# Patient Record
Sex: Male | Born: 1990 | Race: White | Hispanic: No | Marital: Married | Smoking: Never smoker
Health system: Southern US, Community
[De-identification: ages and names within clinical notes are randomized; demographics above are authoritative.]

---

## 2018-06-16 ENCOUNTER — Emergency Department
Admission: EM | Admit: 2018-06-16 | Discharge: 2018-06-16 | Disposition: A | Discharge status: Home / Self Care | Payer: Federal, State, Local not specified - PPO | Attending: Emergency Medicine | Admitting: Emergency Medicine

## 2018-06-16 ENCOUNTER — Other Ambulatory Visit: Payer: Self-pay

## 2018-06-16 ENCOUNTER — Emergency Department: Payer: Federal, State, Local not specified - PPO

## 2018-06-16 ENCOUNTER — Encounter: Payer: Self-pay | Admitting: Emergency Medicine

## 2018-06-16 DIAGNOSIS — S82142A Displaced bicondylar fracture of left tibia, initial encounter for closed fracture: Secondary | ICD-10-CM | POA: Diagnosis not present

## 2018-06-16 DIAGNOSIS — W010XXA Fall on same level from slipping, tripping and stumbling without subsequent striking against object, initial encounter: Secondary | ICD-10-CM | POA: Insufficient documentation

## 2018-06-16 DIAGNOSIS — Y9359 Activity, other involving other sports and athletics played individually: Secondary | ICD-10-CM | POA: Insufficient documentation

## 2018-06-16 DIAGNOSIS — Y9289 Other specified places as the place of occurrence of the external cause: Secondary | ICD-10-CM | POA: Insufficient documentation

## 2018-06-16 DIAGNOSIS — S8992XA Unspecified injury of left lower leg, initial encounter: Secondary | ICD-10-CM | POA: Diagnosis present

## 2018-06-16 DIAGNOSIS — Y998 Other external cause status: Secondary | ICD-10-CM | POA: Diagnosis not present

## 2018-06-16 LAB — COMPREHENSIVE METABOLIC PANEL
ALT: 29 U/L (ref 0–44)
AST: 35 U/L (ref 15–41)
Albumin: 5 g/dL (ref 3.5–5.0)
Alkaline Phosphatase: 58 U/L (ref 38–126)
Anion gap: 15 (ref 5–15)
BUN: 12 mg/dL (ref 6–20)
CHLORIDE: 104 mmol/L (ref 98–111)
CO2: 21 mmol/L — AB (ref 22–32)
Calcium: 9.6 mg/dL (ref 8.9–10.3)
Creatinine, Ser: 1.48 mg/dL — ABNORMAL HIGH (ref 0.61–1.24)
GFR calc Af Amer: >60 mL/min (ref >60)
GFR calc non Af Amer: >60 mL/min (ref >60)
Glucose, Bld: 158 mg/dL — ABNORMAL HIGH (ref 70–99)
POTASSIUM: 3.6 mmol/L (ref 3.5–5.1)
SODIUM: 140 mmol/L (ref 135–145)
Total Bilirubin: 1.1 mg/dL (ref 0.3–1.2)
Total Protein: 7.6 g/dL (ref 6.5–8.1)

## 2018-06-16 LAB — CBC WITH DIFFERENTIAL/PLATELET
Abs Immature Granulocytes: 0.03 10*3/uL (ref 0.00–0.07)
Basophils Absolute: 0 10*3/uL (ref 0.0–0.1)
Basophils Relative: 0 %
Eosinophils Absolute: 0 10*3/uL (ref 0.0–0.5)
Eosinophils Relative: 0 %
HCT: 43.9 % (ref 39.0–52.0)
Hemoglobin: 14.7 g/dL (ref 13.0–17.0)
Immature Granulocytes: 0 %
LYMPHS ABS: 3.1 10*3/uL (ref 0.7–4.0)
LYMPHS PCT: 28 %
MCH: 30.2 pg (ref 26.0–34.0)
MCHC: 33.5 g/dL (ref 30.0–36.0)
MCV: 90.1 fL (ref 80.0–100.0)
Monocytes Absolute: 0.6 10*3/uL (ref 0.1–1.0)
Monocytes Relative: 5 %
Neutro Abs: 7.5 10*3/uL (ref 1.7–7.7)
Neutrophils Relative %: 67 %
Platelets: 252 10*3/uL (ref 150–400)
RBC: 4.87 MIL/uL (ref 4.22–5.81)
RDW: 12.5 % (ref 11.5–15.5)
WBC: 11.4 10*3/uL — ABNORMAL HIGH (ref 4.0–10.5)
nRBC: 0 % (ref 0.0–0.2)

## 2018-06-16 MED ORDER — OXYCODONE-ACETAMINOPHEN 5-325 MG PO TABS: 1.0000 | ORAL_TABLET | Freq: Four times a day (QID) | ORAL | 0 refills | Status: DC | PRN

## 2018-06-16 MED ORDER — SODIUM CHLORIDE 0.9 % IV BOLUS
1000.0000 mL | Freq: Once | INTRAVENOUS | Status: AC
  Administered 2018-06-16: 1000 mL via INTRAVENOUS

## 2018-06-16 NOTE — ED Notes (Signed)
During triage, pt said he felt light headed; he got pale and his pupils dilated significantly. Pt did not pass out, but appeared as though he might. Episode lasted about 30-45 seconds, after which his pupils returned to 81mm and his color returned to normal. He was given an ice pack for his neck, as he began to perspire after the episode. Pt alert & oriented at this time.

## 2018-06-16 NOTE — ED Notes (Signed)
PT states he is feeling better than in triage.  Color has returned and diaphoresis diminished.  Pt assisted onto stretcher, knees elevated in bed.

## 2018-06-16 NOTE — ED Triage Notes (Signed)
Pt via pov from home with left knee pain. He fell while playing disc golf and fell on it. Visible swelling to left leg. Pt alert & oriented with NAD noted.

## 2018-06-16 NOTE — ED Provider Notes (Signed)
[facility] Emergency Department Provider Note  ____________________________________________  Time seen: Approximately 4:08 PM  I have reviewed the triage vital signs and the nursing notes.   HISTORY  Chief Complaint Knee Pain    HPI Ryan Lin is a 28 y.o. male who presents the emergency department  after complaint of left knee injury.  Patient was playing disc golf when he caught his foot on a root, causing his like to twist and him falling landing on his knee.  Patient reports that this was on a sharp incline and he did fall and rolled down the hill.  He did not strike his head or lose consciousness.  Patient reports that he has significant pain with weightbearing but at rest there is minimal pain.  Patient reports that he did have an episode after the injury where he almost passed out.  Patient reports that things went "black" but he was able to hear people talking and feels sensations.  Again, patient did not hit his head.  He denies any headache, neck pain, chest pain, shortness of breath, abdominal pain, nausea or vomiting.  Only complaint is left knee pain/injury.  Pain is mild at this time.  No medications for his complaint prior to arrival.        History reviewed. No pertinent past medical history.  There are no active problems to display for this patient.   History reviewed. No pertinent surgical history.  Prior to Admission medications   Medication Sig Start Date End Date Taking? Authorizing Provider  oxyCODONE-acetaminophen (PERCOCET/ROXICET) 5-325 MG tablet Take 1 tablet by mouth every 6 (six) hours as needed for severe pain. 06/16/18   Kielan Dreisbach, Delorise Royals, PA-C    Allergies Patient has no allergy information on record.  History reviewed. No pertinent family history.  Social History Social History   Tobacco Use  . Smoking status: Never Smoker  . Smokeless tobacco: Never Used  Substance Use Topics  . Alcohol use: Not on file   Comment: occasional  . Drug use: Never     Review of Systems  Constitutional: No fever/chills Eyes: No visual changes. No discharge ENT: No upper respiratory complaints. Cardiovascular: no chest pain. Respiratory: no cough. No SOB. Gastrointestinal: No abdominal pain.  No nausea, no vomiting.   Musculoskeletal: Positive for left knee injury/pain Skin: Negative for rash, abrasions, lacerations, ecchymosis. Neurological: Negative for headaches, focal weakness or numbness.  Near syncope. 10-point ROS otherwise negative.  ____________________________________________   PHYSICAL EXAM:  VITAL SIGNS: ED Triage Vitals  Enc Vitals Group     BP 06/16/18 1552 (!) 97/55     Pulse Rate 06/16/18 1552 79     Resp 06/16/18 1552 18     Temp 06/16/18 1552 (!) 97.5 F (36.4 C)     Temp Source 06/16/18 1552 Oral     SpO2 06/16/18 1552 97 %     Weight 06/16/18 1553 225 lb (102.1 kg)     Height 06/16/18 1553 5\' 10"  (1.778 m)     Head Circumference --      Peak Flow --      Pain Score 06/16/18 1553 3     Pain Loc --      Pain Edu? --      Excl. in GC? --      Constitutional: Alert and oriented. Well appearing and in no acute distress. Eyes: Conjunctivae are normal. PERRL. EOMI. Head: Atraumatic. Neck: No stridor.  No tenderness to palpation of the cervical spine. Cardiovascular: Normal  rate, regular rhythm. Normal S1 and S2.  No murmurs, rubs, gallops.  Good peripheral circulation. Respiratory: Normal respiratory effort without tachypnea or retractions. Lungs CTAB. Good air entry to the bases with no decreased or absent breath sounds. Musculoskeletal: Full range of motion to all extremities. No gross deformities appreciated.  Visualization of the left knee reveals gross edema to the lateral aspect.  No gross deformity.  Patient is able to extend and flex the knee appropriately at this time.  No overlying abrasions or lacerations noted.  Patient is nontender to palpation over the distal  femur, patella, medial joint line.  Patient has mild tenderness to palpation along the lateral joint line and over the lateral aspect of the tibia.  No palpable abnormality other than soft tissue edema.  Pending imaging, no special tests of the knee are performed.  These will be performed after imaging.  Radial pulse and sensation intact distally.  Imaging returns with significant tibial plateau fracture.  No special tests of the knee are performed at this time.  Patient is still neurovascularly intact distally. Neurologic:  Normal speech and language. No gross focal neurologic deficits are appreciated.  Cranial nerves II through XII grossly intact. Skin:  Skin is warm, dry and intact. No rash noted. Psychiatric: Mood and affect are normal. Speech and behavior are normal. Patient exhibits appropriate insight and judgement.   ____________________________________________   LABS (all labs ordered are listed, but only abnormal results are displayed)  Labs Reviewed  COMPREHENSIVE METABOLIC PANEL - Abnormal; Notable for the following components:      Result Value   CO2 21 (*)    Glucose, Bld 158 (*)    Creatinine, Ser 1.48 (*)    All other components within normal limits  CBC WITH DIFFERENTIAL/PLATELET - Abnormal; Notable for the following components:   WBC 11.4 (*)    All other components within normal limits   ____________________________________________  EKG  ED ECG REPORT I, Delorise RoyalsJonathan D Quierra Silverio,  personally viewed and interpreted this ECG.   Date: 06/16/2018  EKG Time: 1628  Rate: 75  Rhythm: normal EKG, normal sinus rhythm, there are no previous tracings available for comparison  Axis: normal axis  Intervals:none  ST&T Change: Few scattered ST elevation segments noted in both the inferior, anterior and lateral leads.  Likely early repolarization.     ____________________________________________  RADIOLOGY I personally viewed and evaluated these images as part of my medical  decision making, as well as reviewing the written report by the radiologist.  Ct Knee Left Wo Contrast  Result Date: 06/16/2018 CLINICAL DATA:  Larey SeatFell while playing disc golf. Pain and swelling. Evaluate complex medial tibial plateau fracture. EXAM: CT OF THE left KNEE WITHOUT CONTRAST TECHNIQUE: Multidetector CT imaging of the left knee was performed according to the standard protocol. Multiplanar CT image reconstructions were also generated. COMPARISON:  Radiographs 06/16/2018 FINDINGS: Complex comminuted intra-articular fracture of the medial tibial plateau. This is a vertical split type fracture with significant displacement of the medial fragment up to 19 mm. The fracture also involves the tibial spines and extends into the lateral tibial plateau medially and posteriorly. The femur, patella and fibula are intact. Grossly the cruciate and collateral ligaments are intact. The MCL is moderately stretched. The quadriceps and patellar tendons are intact. Large lipohemarthrosis. IMPRESSION: 1. Complex comminuted longitudinal split type fracture of the medial tibial plateau as detailed above. Please see 3D images. 2. There is also a fracture through the tibial spines and involving the posterior and  medial aspect of the lateral tibial plateau. 3. No other fractures are identified. 4. Grossly intact ligaments and tendons. 5. Large lipohemarthrosis. Electronically Signed   By: Rudie Meyer M.D.   On: 06/16/2018 17:46   Dg Knee Complete 4 Views Left  Result Date: 06/16/2018 CLINICAL DATA:  Left knee pain following a twisting injury and fall today. EXAM: LEFT KNEE - COMPLETE 4+ VIEW COMPARISON:  None. FINDINGS: Vertical fracture through the medial tibial plateau with 6 mm of distraction of the fragments approximately. There is an associated moderate-sized effusion. IMPRESSION: Medial tibial plateau fracture with an associated moderate-sized effusion. Electronically Signed   By: Beckie Salts M.D.   On: 06/16/2018  17:02    ____________________________________________    PROCEDURES  Procedure(s) performed:    .Splint Application Date/Time: 06/16/2018 6:37 PM Performed by: Racheal Patches, PA-C Authorized by: Racheal Patches, PA-C   Consent:    Consent obtained:  Verbal   Consent given by:  Patient   Risks discussed:  Pain and swelling Pre-procedure details:    Sensation:  Normal Procedure details:    Laterality:  Left   Location:  Knee   Knee:  L knee   Splint type:  Knee immobilizer   Supplies:  Prefabricated splint Post-procedure details:    Pain:  Unchanged   Sensation:  Normal   Patient tolerance of procedure:  Tolerated well, no immediate complications      Medications  sodium chloride 0.9 % bolus 1,000 mL (0 mLs Intravenous Stopped 06/16/18 1835)     ____________________________________________   INITIAL IMPRESSION / ASSESSMENT AND PLAN / ED COURSE  Pertinent labs & imaging results that were available during my care of the patient were reviewed by me and considered in my medical decision making (see chart for details).  Review of the [place] CSRS was performed in accordance of the [facility] prior to dispensing any controlled drugs.  Clinical Course as of Jun 15 1941  Sat Jun 16, 2018  1620 Patient presents emergency department with complaints of left knee injury.  Patient reports that he injured his knee while playing this cough.  Patient does have appreciable edema to the lateral aspect of the knee.  Patient reports that he did not hit his head or lose consciousness during the original injury.  He denied any other complaints other than knee pain.  Patient had a near syncope episode where he reports things going "black" but still feeling sensation and hearing voices around him.  I suspect that this near syncopal episode was likely physiological due to pain and stress of injury, however he will be evaluated with EKG, basic lab work.  Patient will be given IV fluids as  his blood pressure is slightly soft at 97/55.  At this time, with a reassuring neuro exam, no indication of head injury, no headache or other neurological symptoms, I will withhold imaging of patient's head and neck region unless symptoms return or worsen.   [JC]    Clinical Course User Index [JC] Ryah Cribb, Delorise Royals, PA-C          Patient's diagnosis is consistent with a tibial plateau fracture.  Patient presented to the emergency department with a complaint of left knee injury.  On exam, patient had gross edema with no deformity.  Patient also had a presyncopal episode here in the emergency department.  Patient does have a significant tibial plateau fracture identified on x-ray.  Given location, nature of fracture I further evaluated area with CT scan.  I discussed the case with the on-call orthopedic surgeon, Dr. Odis Luster.  He is concerned at this time with acute ligamentous injury even with a reassuring CT given nature of injury.  At this time he recommends follow-up in the office, MRI prior to surgery.  Patient will be placed on pain medication for at home use.  Knee immobilizer with strict precautions to be nonweightbearing.  Patient is given crutches for ambulation.  He will follow-up with orthopedic surgery.. Patient is given ED precautions to return to the ED for any worsening or new symptoms.     ____________________________________________  FINAL CLINICAL IMPRESSION(S) / ED DIAGNOSES  Final diagnoses:  Closed fracture of left tibial plateau, initial encounter      NEW MEDICATIONS STARTED DURING THIS VISIT:  ED Discharge Orders         Ordered    oxyCODONE-acetaminophen (PERCOCET/ROXICET) 5-325 MG tablet  Every 6 hours PRN,   Status:  Discontinued     06/16/18 1833    oxyCODONE-acetaminophen (PERCOCET/ROXICET) 5-325 MG tablet  Every 6 hours PRN     06/16/18 1833              This chart was dictated using voice recognition software/Dragon. Despite best efforts to  proofread, errors can occur which can change the meaning. Any change was purely unintentional.    Racheal Patches, PA-C 06/16/18 1944    Sharman Cheek, MD 06/18/18 [phone]

## 2018-06-19 ENCOUNTER — Ambulatory Visit: Payer: Self-pay | Admitting: Orthopedic Surgery

## 2018-06-19 MED ORDER — CEFAZOLIN SODIUM-DEXTROSE 2-4 GM/100ML-% IV SOLN
2.0000 g | INTRAVENOUS | Status: AC
  Administered 2018-06-20: 2 g via INTRAVENOUS

## 2018-06-20 ENCOUNTER — Ambulatory Visit: Payer: Federal, State, Local not specified - PPO

## 2018-06-20 ENCOUNTER — Ambulatory Visit: Payer: Federal, State, Local not specified - PPO | Admitting: Anesthesiology

## 2018-06-20 ENCOUNTER — Encounter
Admission: RE | Disposition: A | Discharge status: Home / Self Care | Payer: Self-pay | Source: Home / Self Care | Attending: Orthopedic Surgery

## 2018-06-20 ENCOUNTER — Encounter: Payer: Self-pay | Admitting: *Deleted

## 2018-06-20 ENCOUNTER — Ambulatory Visit
Admission: RE | Admit: 2018-06-20 | Discharge: 2018-06-20 | Disposition: A | Discharge status: Home / Self Care | Payer: Federal, State, Local not specified - PPO | Attending: Orthopedic Surgery | Admitting: Orthopedic Surgery

## 2018-06-20 ENCOUNTER — Other Ambulatory Visit: Payer: Self-pay

## 2018-06-20 DIAGNOSIS — E669 Obesity, unspecified: Secondary | ICD-10-CM | POA: Diagnosis not present

## 2018-06-20 DIAGNOSIS — S82142A Displaced bicondylar fracture of left tibia, initial encounter for closed fracture: Secondary | ICD-10-CM | POA: Diagnosis present

## 2018-06-20 DIAGNOSIS — Y9353 Activity, golf: Secondary | ICD-10-CM | POA: Insufficient documentation

## 2018-06-20 DIAGNOSIS — Z6832 Body mass index (BMI) 32.0-32.9, adult: Secondary | ICD-10-CM | POA: Diagnosis not present

## 2018-06-20 DIAGNOSIS — Z419 Encounter for procedure for purposes other than remedying health state, unspecified: Secondary | ICD-10-CM

## 2018-06-20 DIAGNOSIS — W010XXA Fall on same level from slipping, tripping and stumbling without subsequent striking against object, initial encounter: Secondary | ICD-10-CM | POA: Insufficient documentation

## 2018-06-20 HISTORY — PX: ORIF TIBIA PLATEAU: SHX2132

## 2018-06-20 LAB — URINE DRUG SCREEN, QUALITATIVE (ARMC ONLY)
Amphetamines, Ur Screen: NOT DETECTED
Barbiturates, Ur Screen: NOT DETECTED
Benzodiazepine, Ur Scrn: NOT DETECTED
Cannabinoid 50 Ng, Ur ~~LOC~~: NOT DETECTED
Cocaine Metabolite,Ur ~~LOC~~: NOT DETECTED
MDMA (Ecstasy)Ur Screen: NOT DETECTED
METHADONE SCREEN, URINE: NOT DETECTED
Opiate, Ur Screen: NOT DETECTED
Phencyclidine (PCP) Ur S: NOT DETECTED
Tricyclic, Ur Screen: NOT DETECTED

## 2018-06-20 SURGERY — OPEN REDUCTION INTERNAL FIXATION (ORIF) TIBIAL PLATEAU
Anesthesia: Regional | Laterality: Left

## 2018-06-20 MED ORDER — OXYCODONE-ACETAMINOPHEN 5-325 MG PO TABS
ORAL_TABLET | ORAL | Status: AC
  Filled 2018-06-20: qty 1

## 2018-06-20 MED ORDER — ACETAMINOPHEN 500 MG PO TABS
1000.0000 mg | ORAL_TABLET | Freq: Once | ORAL | Status: AC
  Administered 2018-06-20: 1000 mg via ORAL

## 2018-06-20 MED ORDER — SUGAMMADEX SODIUM 200 MG/2ML IV SOLN
INTRAVENOUS | Status: DC | PRN
  Administered 2018-06-20: 200 mg via INTRAVENOUS

## 2018-06-20 MED ORDER — FAMOTIDINE 20 MG PO TABS
20.0000 mg | ORAL_TABLET | Freq: Once | ORAL | Status: AC
  Administered 2018-06-20: 20 mg via ORAL

## 2018-06-20 MED ORDER — LIDOCAINE HCL (CARDIAC) PF 100 MG/5ML IV SOSY
PREFILLED_SYRINGE | INTRAVENOUS | Status: DC | PRN
  Administered 2018-06-20: 100 mg via INTRAVENOUS

## 2018-06-20 MED ORDER — HYDROMORPHONE HCL 1 MG/ML IJ SOLN
0.2500 mg | INTRAMUSCULAR | Status: DC | PRN
  Administered 2018-06-20 (×4): 0.25 mg via INTRAVENOUS

## 2018-06-20 MED ORDER — DOCUSATE SODIUM 100 MG PO CAPS: 100.0000 mg | ORAL_CAPSULE | Freq: Every day | ORAL | 2 refills | Status: AC | PRN

## 2018-06-20 MED ORDER — FENTANYL CITRATE (PF) 100 MCG/2ML IJ SOLN
INTRAMUSCULAR | Status: AC
  Filled 2018-06-20: qty 2

## 2018-06-20 MED ORDER — MIDAZOLAM HCL 2 MG/2ML IJ SOLN
INTRAMUSCULAR | Status: DC | PRN
  Administered 2018-06-20: 2 mg via INTRAVENOUS

## 2018-06-20 MED ORDER — BUPIVACAINE LIPOSOME 1.3 % IJ SUSP: 20.0000 mL | Freq: Once | INTRAMUSCULAR | Status: DC

## 2018-06-20 MED ORDER — MIDAZOLAM HCL 2 MG/2ML IJ SOLN
1.0000 mg | Freq: Once | INTRAMUSCULAR | Status: AC
  Administered 2018-06-20: 1 mg via INTRAVENOUS

## 2018-06-20 MED ORDER — OXYCODONE-ACETAMINOPHEN 5-325 MG PO TABS: 1.0000 | ORAL_TABLET | ORAL | 0 refills | Status: AC | PRN

## 2018-06-20 MED ORDER — OXYCODONE-ACETAMINOPHEN 5-325 MG PO TABS
1.0000 | ORAL_TABLET | ORAL | Status: DC | PRN
  Administered 2018-06-20: 1 via ORAL

## 2018-06-20 MED ORDER — FENTANYL CITRATE (PF) 250 MCG/5ML IJ SOLN
INTRAMUSCULAR | Status: AC
  Filled 2018-06-20: qty 5

## 2018-06-20 MED ORDER — CHLORHEXIDINE GLUCONATE 4 % EX LIQD: 60.0000 mL | Freq: Once | CUTANEOUS | Status: DC

## 2018-06-20 MED ORDER — DEXAMETHASONE SODIUM PHOSPHATE 10 MG/ML IJ SOLN
INTRAMUSCULAR | Status: DC | PRN
  Administered 2018-06-20: 4 mg via INTRAVENOUS

## 2018-06-20 MED ORDER — DEXMEDETOMIDINE HCL IN NACL 200 MCG/50ML IV SOLN
INTRAVENOUS | Status: AC
  Filled 2018-06-20: qty 50

## 2018-06-20 MED ORDER — SUCCINYLCHOLINE CHLORIDE 20 MG/ML IJ SOLN
INTRAMUSCULAR | Status: DC | PRN
  Administered 2018-06-20: 100 mg via INTRAVENOUS

## 2018-06-20 MED ORDER — ROCURONIUM BROMIDE 100 MG/10ML IV SOLN
INTRAVENOUS | Status: DC | PRN
  Administered 2018-06-20: 40 mg via INTRAVENOUS

## 2018-06-20 MED ORDER — PROPOFOL 10 MG/ML IV BOLUS
INTRAVENOUS | Status: AC
  Filled 2018-06-20: qty 20

## 2018-06-20 MED ORDER — HYDROMORPHONE HCL 1 MG/ML IJ SOLN
INTRAMUSCULAR | Status: AC
  Filled 2018-06-20: qty 1

## 2018-06-20 MED ORDER — MIDAZOLAM HCL 2 MG/2ML IJ SOLN
INTRAMUSCULAR | Status: AC
  Filled 2018-06-20: qty 2

## 2018-06-20 MED ORDER — ACETAMINOPHEN 500 MG PO TABS
ORAL_TABLET | ORAL | Status: AC
  Administered 2018-06-20: 1000 mg via ORAL
  Filled 2018-06-20: qty 2

## 2018-06-20 MED ORDER — ONDANSETRON HCL 4 MG/2ML IJ SOLN
INTRAMUSCULAR | Status: DC | PRN
  Administered 2018-06-20: 4 mg via INTRAVENOUS

## 2018-06-20 MED ORDER — LIDOCAINE HCL (PF) 1 % IJ SOLN
INTRAMUSCULAR | Status: AC
  Filled 2018-06-20: qty 5

## 2018-06-20 MED ORDER — FENTANYL CITRATE (PF) 100 MCG/2ML IJ SOLN
50.0000 ug | Freq: Once | INTRAMUSCULAR | Status: AC
  Administered 2018-06-20: 50 ug via INTRAVENOUS

## 2018-06-20 MED ORDER — BUPIVACAINE HCL (PF) 0.5 % IJ SOLN
INTRAMUSCULAR | Status: AC
  Filled 2018-06-20: qty 10

## 2018-06-20 MED ORDER — SODIUM CHLORIDE 0.9 % IR SOLN
Status: DC | PRN
  Administered 2018-06-20: 1000 mL

## 2018-06-20 MED ORDER — LACTATED RINGERS IV SOLN
INTRAVENOUS | Status: DC
  Administered 2018-06-20 (×2): via INTRAVENOUS

## 2018-06-20 MED ORDER — DEXMEDETOMIDINE HCL 200 MCG/2ML IV SOLN
INTRAVENOUS | Status: DC | PRN
  Administered 2018-06-20 (×2): 12 ug via INTRAVENOUS

## 2018-06-20 MED ORDER — FAMOTIDINE 20 MG PO TABS
ORAL_TABLET | ORAL | Status: AC
  Administered 2018-06-20: 20 mg via ORAL
  Filled 2018-06-20: qty 1

## 2018-06-20 MED ORDER — SUGAMMADEX SODIUM 200 MG/2ML IV SOLN
INTRAVENOUS | Status: AC
  Filled 2018-06-20: qty 2

## 2018-06-20 MED ORDER — FENTANYL CITRATE (PF) 100 MCG/2ML IJ SOLN
INTRAMUSCULAR | Status: DC | PRN
  Administered 2018-06-20: 50 ug via INTRAVENOUS
  Administered 2018-06-20: 100 ug via INTRAVENOUS
  Administered 2018-06-20 (×2): 50 ug via INTRAVENOUS

## 2018-06-20 MED ORDER — PROPOFOL 10 MG/ML IV BOLUS
INTRAVENOUS | Status: DC | PRN
  Administered 2018-06-20: 40 mg via INTRAVENOUS
  Administered 2018-06-20: 160 mg via INTRAVENOUS

## 2018-06-20 MED ORDER — CEFAZOLIN SODIUM-DEXTROSE 2-4 GM/100ML-% IV SOLN
INTRAVENOUS | Status: AC
  Filled 2018-06-20: qty 100

## 2018-06-20 SURGICAL SUPPLY — 60 items
BANDAGE ACE 6X5 VEL STRL LF (GAUZE/BANDAGES/DRESSINGS) ×3 IMPLANT
BIT DRILL 2.5X110 QC LCP DISP (BIT) ×3 IMPLANT
BIT DRILL 2.8 (BIT) ×1
BIT DRILL CANN QC 2.8X165 (BIT) ×1 IMPLANT
BNDG COHESIVE 4X5 TAN STRL (GAUZE/BANDAGES/DRESSINGS) ×3 IMPLANT
BNDG ESMARK 6X12 TAN STRL LF (GAUZE/BANDAGES/DRESSINGS) ×3 IMPLANT
BRUSH SCRUB EZ  4% CHG (MISCELLANEOUS) ×2
BRUSH SCRUB EZ 4% CHG (MISCELLANEOUS) ×1 IMPLANT
CANISTER SUCT 1200ML W/VALVE (MISCELLANEOUS) ×3 IMPLANT
CHLORAPREP W/TINT 26 (MISCELLANEOUS) ×3 IMPLANT
COOLER POLAR GLACIER W/PUMP (MISCELLANEOUS) ×3 IMPLANT
COVER WAND RF STERILE (DRAPES) IMPLANT
CUFF TOURN SGL QUICK 24 (TOURNIQUET CUFF)
CUFF TOURN SGL QUICK 30 (TOURNIQUET CUFF) ×2
CUFF TRNQT CYL 24X4X16.5-23 (TOURNIQUET CUFF) IMPLANT
CUFF TRNQT CYL 30X4X21-28X (TOURNIQUET CUFF) ×1 IMPLANT
DRAPE C-ARM XRAY 36X54 (DRAPES) ×3 IMPLANT
DRAPE C-ARMOR (DRAPES) ×3 IMPLANT
DRAPE INCISE IOBAN 66X45 STRL (DRAPES) ×3 IMPLANT
DRAPE TABLE BACK 80X90 (DRAPES) ×3 IMPLANT
DRILL BIT 2.8MM (BIT) ×2
DRSG AQUACEL AG ADV 3.5X10 (GAUZE/BANDAGES/DRESSINGS) IMPLANT
ELECT REM PT RETURN 9FT ADLT (ELECTROSURGICAL) ×3
ELECTRODE REM PT RTRN 9FT ADLT (ELECTROSURGICAL) ×1 IMPLANT
GAUZE PETRO XEROFOAM 1X8 (MISCELLANEOUS) ×3 IMPLANT
GAUZE SPONGE 4X4 12PLY STRL (GAUZE/BANDAGES/DRESSINGS) ×3 IMPLANT
GLOVE INDICATOR 8.0 STRL GRN (GLOVE) ×3 IMPLANT
GLOVE SURG ORTHO 8.0 STRL STRW (GLOVE) ×3 IMPLANT
GOWN STRL REUS W/ TWL LRG LVL3 (GOWN DISPOSABLE) ×1 IMPLANT
GOWN STRL REUS W/ TWL XL LVL3 (GOWN DISPOSABLE) ×1 IMPLANT
GOWN STRL REUS W/TWL LRG LVL3 (GOWN DISPOSABLE) ×2
GOWN STRL REUS W/TWL XL LVL3 (GOWN DISPOSABLE) ×2
KIT TURNOVER KIT A (KITS) ×3 IMPLANT
MAT ABSORB  FLUID 56X50 GRAY (MISCELLANEOUS) ×2
MAT ABSORB FLUID 56X50 GRAY (MISCELLANEOUS) ×1 IMPLANT
NS IRRIG 1000ML POUR BTL (IV SOLUTION) ×3 IMPLANT
PACK EXTREMITY ARMC (MISCELLANEOUS) ×3 IMPLANT
PAD ABD DERMACEA PRESS 5X9 (GAUZE/BANDAGES/DRESSINGS) ×6 IMPLANT
PAD CAST CTTN 4X4 STRL (SOFTGOODS) ×1 IMPLANT
PAD PREP 24X41 OB/GYN DISP (PERSONAL CARE ITEMS) ×3 IMPLANT
PAD WRAPON POLAR KNEE (MISCELLANEOUS) ×1 IMPLANT
PADDING CAST COTTON 4X4 STRL (SOFTGOODS) ×2
PLATE TIBIA PROX 6 H 35MM (Orthopedic Implant) ×1 IMPLANT
SCREW CORTEX 3.5 28MM (Screw) ×2 IMPLANT
SCREW CORTEX 3.5 36MM (Screw) ×2 IMPLANT
SCREW CORTEX 3.5 50MM (Screw) ×3 IMPLANT
SCREW CORTEX 3.5 60MM (Screw) ×3 IMPLANT
SCREW CORTEX 3.5X80MM (Screw) ×3 IMPLANT
SCREW LOCK CORT ST 3.5X28 (Screw) ×1 IMPLANT
SCREW LOCK CORT ST 3.5X36 (Screw) ×1 IMPLANT
SCREW LOCKING SLF TAP 3.5X70MM (Screw) ×6 IMPLANT
SPONGE LAP 18X18 RF (DISPOSABLE) ×3 IMPLANT
STAPLER SKIN PROX 35W (STAPLE) ×3 IMPLANT
STOCKINETTE BIAS CUT 6 980064 (GAUZE/BANDAGES/DRESSINGS) ×3 IMPLANT
STOCKINETTE IMPERVIOUS 9X36 MD (GAUZE/BANDAGES/DRESSINGS) ×3 IMPLANT
SUT VIC AB 2-0 CT1 27 (SUTURE)
SUT VIC AB 2-0 CT1 TAPERPNT 27 (SUTURE) IMPLANT
SUT VICRYL+ 3-0 36IN CT-1 (SUTURE) IMPLANT
TIBIA PLATE PROX 6 H 35MM (Orthopedic Implant) ×3 IMPLANT
WRAPON POLAR PAD KNEE (MISCELLANEOUS) ×3

## 2018-06-20 NOTE — Anesthesia Procedure Notes (Signed)
Procedure Name: Intubation Date/Time: 06/20/2018 10:45 AM Performed by: Justus Memory, CRNA Pre-anesthesia Checklist: Patient identified, Patient being monitored, Timeout performed, Emergency Drugs available and Suction available Patient Re-evaluated:Patient Re-evaluated prior to induction Oxygen Delivery Method: Circle system utilized Preoxygenation: Pre-oxygenation with 100% oxygen Induction Type: IV induction Ventilation: Mask ventilation without difficulty Laryngoscope Size: Mac and 3 Grade View: Grade I Tube type: Oral Tube size: 7.5 mm Number of attempts: 1 Airway Equipment and Method: Stylet Placement Confirmation: ETT inserted through vocal cords under direct vision,  positive ETCO2 and breath sounds checked- equal and bilateral Secured at: 21 cm Tube secured with: Tape Dental Injury: Teeth and Oropharynx as per pre-operative assessment

## 2018-06-20 NOTE — Anesthesia Postprocedure Evaluation (Signed)
Anesthesia Post Note  Patient: Ryan Lin  Procedure(s) Performed: OPEN REDUCTION INTERNAL FIXATION (ORIF) TIBIAL PLATEAU, LEFT (Left )  Patient location during evaluation: PACU Anesthesia Type: Regional and General Level of consciousness: awake and alert Pain management: pain level controlled Vital Signs Assessment: post-procedure vital signs reviewed and stable Respiratory status: spontaneous breathing, nonlabored ventilation, respiratory function stable and patient connected to nasal cannula oxygen Cardiovascular status: blood pressure returned to baseline and stable Postop Assessment: no apparent nausea or vomiting Anesthetic complications: no     Last Vitals:  Vitals:   06/20/18 1327 06/20/18 1403  BP: (!) 143/89 (!) 142/83  Pulse: 77 88  Resp: 18 18  Temp: 36.6 C 36.7 C  SpO2: 93% 94%    Last Pain:  Vitals:   06/20/18 1403  TempSrc: Temporal  PainSc: 5                  Jovita Gamma

## 2018-06-20 NOTE — Anesthesia Post-op Follow-up Note (Signed)
Anesthesia QCDR form completed.        

## 2018-06-20 NOTE — Transfer of Care (Signed)
Immediate Anesthesia Transfer of Care Note  Patient: Ryan Lin  Procedure(s) Performed: OPEN REDUCTION INTERNAL FIXATION (ORIF) TIBIAL PLATEAU, LEFT (Left )  Patient Location: PACU  Anesthesia Type:General  Level of Consciousness: sedated  Airway & Oxygen Therapy: Patient Spontanous Breathing and Patient connected to face mask oxygen  Post-op Assessment: Report given to RN and Post -op Vital signs reviewed and stable  Post vital signs: Reviewed and stable  Last Vitals:  Vitals Value Taken Time  BP 124/85 06/20/2018 12:43 PM  Temp 36.1 C 06/20/2018 12:43 PM  Pulse 82 06/20/2018 12:50 PM  Resp 20 06/20/2018 12:50 PM  SpO2 98 % 06/20/2018 12:50 PM  Vitals shown include unvalidated device data.  Last Pain:  Vitals:   06/20/18 1243  TempSrc:   PainSc: Asleep         Complications: No apparent anesthesia complications

## 2018-06-20 NOTE — Anesthesia Procedure Notes (Signed)
Anesthesia Regional Block: Femoral nerve block   Pre-Anesthetic Checklist: ,, timeout performed, Correct Patient, Correct Site, Correct Laterality, Correct Procedure, Correct Position, site marked, Risks and benefits discussed,  Surgical consent,  Pre-op evaluation,  At surgeon's request and post-op pain management  Laterality: Left  Prep: chloraprep       Needles:  Injection technique: Single-shot  Needle Type: Stimiplex     Needle Length: 9cm  Needle Gauge: 21     Additional Needles:   Procedures:,,,, ultrasound used (permanent image in chart),,,,  Narrative:  Start time: 06/20/2018 9:40 AM End time: 06/20/2018 9:44 AM Injection made incrementally with aspirations every 5 mL.  Performed by: Personally  Anesthesiologist: Jovita Gamma, MD  Additional Notes: Patient consented for risk and benefits of nerve block including but not limited to nerve damage, failed block, bleeding and infection.  Patient voiced understanding.  Functioning IV was confirmed and monitors were applied.  A echogenic needle was used. Sterile prep, sterile drapes, hand hygiene and sterile gloves were used. Minimal sedation used for procedure.   No paresthesia endorsed by patient during the procedure.  Negative aspiration prior to incremental administration of local anesthetic. The patient tolerated the procedure well with no immediate complications.

## 2018-06-20 NOTE — Anesthesia Preprocedure Evaluation (Addendum)
Anesthesia Evaluation  Patient identified by MRN, date of birth, ID band Patient awake    Reviewed: Allergy & Precautions, H&P , NPO status , Patient's Chart, lab work & pertinent test results  Airway Mallampati: I  TM Distance: >3 FB     Dental  (+) Teeth Intact   Pulmonary neg pulmonary ROS, neg sleep apnea, neg COPD, neg recent URI,           Cardiovascular (-) hypertension(-) anginanegative cardio ROS  (-) dysrhythmias      Neuro/Psych negative neurological ROS  negative psych ROS   GI/Hepatic negative GI ROS, Neg liver ROS, neg GERD  ,  Endo/Other  negative endocrine ROS  Renal/GU      Musculoskeletal   Abdominal   Peds  Hematology negative hematology ROS (+)   Anesthesia Other Findings Obese  History reviewed. No pertinent past medical history.  History reviewed. No pertinent surgical history.  BMI    Body Mass Index:  32.28 kg/m      Reproductive/Obstetrics negative OB ROS                           Anesthesia Physical Anesthesia Plan  ASA: II  Anesthesia Plan: General ETT and Regional   Post-op Pain Management:    Induction:   PONV Risk Score and Plan: Dexamethasone, Ondansetron, Midazolam and Treatment may vary due to age or medical condition  Airway Management Planned:   Additional Equipment:   Intra-op Plan:   Post-operative Plan:   Informed Consent: I have reviewed the patients History and Physical, chart, labs and discussed the procedure including the risks, benefits and alternatives for the proposed anesthesia with the patient or authorized representative who has indicated his/her understanding and acceptance.     Dental Advisory Given  Plan Discussed with: Anesthesiologist and CRNA  Anesthesia Plan Comments: (Femoral nerve block + GA)      Anesthesia Quick Evaluation

## 2018-06-20 NOTE — Op Note (Signed)
06/20/2018  12:36 PM  PATIENT:  Ryan Lin    PRE-OPERATIVE DIAGNOSIS:  I33.825K DISPLACED FREACTURE OF LEFT TIBIA  POST-OPERATIVE DIAGNOSIS:  Same  PROCEDURE:  OPEN REDUCTION INTERNAL FIXATION (ORIF) TIBIAL PLATEAU, LEFT  SURGEON:  Lyndle Herrlich, MD  ASSIST: Altamese Cabal, PA-C  ANESTHESIA: General and Femoral nerve block  PREOPERATIVE INDICATIONS:  Ryan Lin is a  28 y.o. male with a diagnosis of S82.112A DISPLACED FREACTURE OF LEFT TIBIA who elected for surgical management after extensive discussion of  the risks benefits and alternatives  with the patient preoperatively including but not limited to the risks of infection, bleeding, nerve injury, cardiopulmonary complications, the need for revision surgery, among others, and the patient was willing to proceed.  EBL: 50 cc  OPERATIVE IMPLANTS: Synthes 6 hole medial tibial plate, 2 locking screws, 5 non-locking screws  OPERATIVE FINDINGS: Unstable bicondylar tibial plateau fracture  DESCRIPTION OF PROCEDURE:  The patient was brought to the operating room and underwent satisfactory anesthesia and was placed in the supine position on the fracture table.  The operative leg was prepped and draped in sterile fashion.  A longitudinal lazy S type medial incision was made centered over the medial femur and proximal tibia with dissection carried out sharply to fascia.  The medial hamstrings were divided and tagged for later repair.  The medial tibia was exposed.  Irrigation was used. The fracture fragments were manipulated into the best possible position due to the comminution. The central fragment was elevated.  A Synthes medial tibial plate was aligned along the tibia.  It was temporarily fixated with K wires proximally.  The plate was compressed proximally and distally with large bone holding clamps. Next a compression screw was placed proximally with excellent restoration of the medial joint line.  Cortical screws were then placed  through the distal portion of the plate.  The remaining proximal holes were filled with locking screws.  Fluoroscopy showed overall good alignment on AP and lateral views. Wound was again irrigated and closed with #1 Vicryl, #2 Quill on fascia, 2-0 Vicryl on subcutaneous tissue and staples on all skin areas and staples for the lateral stab wounds.  A sterile dressing was applied.  Sponge and needle count was correct.  Soft long-leg dressing, Cryocuff and knee immobilizer were applied.  The patient was awakened and taken to recovery in good condition.   Cassell Smiles, MD

## 2018-06-20 NOTE — Discharge Instructions (Addendum)
STRICT NON-WEIGHTBEARING ON THE LEFT LEG May remove dressing and place band-aid on Monday Call [phone] with questions    AMBULATORY SURGERY  DISCHARGE INSTRUCTIONS   1) The drugs that you were given will stay in your system until tomorrow so for the next 24 hours you should not:  A) Drive an automobile B) Make any legal decisions C) Drink any alcoholic beverage   2) You may resume regular meals tomorrow.  Today it is better to start with liquids and gradually work up to solid foods.  You may eat anything you prefer, but it is better to start with liquids, then soup and crackers, and gradually work up to solid foods.   3) Please notify your doctor immediately if you have any unusual bleeding, trouble breathing, redness and pain at the surgery site, drainage, fever, or pain not relieved by medication.    4) Additional Instructions:        Please contact your physician with any problems or Same Day Surgery at [phone], Monday through Friday 6 am to 4 pm, or [place] at [facility] number at [phone].

## 2018-06-20 NOTE — H&P (Signed)
The patient has been re-examined, and the chart reviewed, and there have been no interval changes to the documented history and physical.  Plan a left tibial plateau open reduction and internal fixation today.  Anesthesia is consulted regarding a peripheral nerve block for post-operative pain.  The risks, benefits, and alternatives have been discussed at length, and the patient is willing to proceed.

## 2018-07-24 NOTE — H&P (Signed)
PREOPERATIVE H&P  Chief Complaint: [id number] DISPLACED FREACTURE OF LEFT TIBIA  HPI: Ryan Lin is a 28 y.o. male who presents for preoperative history and physical with a diagnosis of S82.112A DISPLACED FREACTURE OF LEFT TIBIA. Symptoms are rated as moderate to severe, and have been worsening.  This is significantly impairing activities of daily living.  He has elected for surgical management.   History reviewed. No pertinent past medical history. Past Surgical History:  Procedure Laterality Date  . ORIF TIBIA PLATEAU Left 06/20/2018   Procedure: OPEN REDUCTION INTERNAL FIXATION (ORIF) TIBIAL PLATEAU, LEFT;  Surgeon: Lyndle HerrlichBowers, Maddon Horton R, MD;  Location: [facility];  Service: Orthopedics;  Laterality: Left;   Social History   Socioeconomic History  . Marital status: Single    Spouse name: Not on file  . Number of children: Not on file  . Years of education: Not on file  . Highest education level: Not on file  Occupational History  . Not on file  Social Needs  . Financial resource strain: Not on file  . Food insecurity:    Worry: Not on file    Inability: Not on file  . Transportation needs:    Medical: Not on file    Non-medical: Not on file  Tobacco Use  . Smoking status: Never Smoker  . Smokeless tobacco: Never Used  Substance and Sexual Activity  . Alcohol use: Not on file    Comment: occasional  . Drug use: Never  . Sexual activity: Not on file  Lifestyle  . Physical activity:    Days per week: Not on file    Minutes per session: Not on file  . Stress: Not on file  Relationships  . Social connections:    Talks on phone: Not on file    Gets together: Not on file    Attends religious service: Not on file    Active member of club or organization: Not on file    Attends meetings of clubs or organizations: Not on file    Relationship status: Not on file  Other Topics Concern  . Not on file  Social History Narrative  . Not on file   History reviewed. No pertinent  family history. No Known Allergies Prior to Admission medications   Medication Sig Start Date End Date Taking? Authorizing Provider  ibuprofen (ADVIL,MOTRIN) 200 MG tablet Take 200 mg by mouth every 8 (eight) hours as needed (pain.).   Yes [provider]  docusate sodium (COLACE) 100 MG capsule Take 1 capsule (100 mg total) by mouth daily as needed. 06/20/18 06/20/19  Lyndle HerrlichBowers, Mekenzie Modeste R, MD  oxyCODONE-acetaminophen (PERCOCET) 5-325 MG tablet Take 1 tablet by mouth every 4 (four) hours as needed for severe pain. 06/20/18 06/20/19  Lyndle HerrlichBowers, Juanisha Bautch R, MD     Positive ROS: All other systems have been reviewed and were otherwise negative with the exception of those mentioned in the HPI and as above.  Physical Exam: General: Alert, no acute distress Cardiovascular: Regular rate and rhythm, no murmurs rubs or gallops.  No pedal edema Respiratory: Clear to auscultation bilaterally, no wheezes rales or rhonchi. No cyanosis, no use of accessory musculature GI: No organomegaly, abdomen is soft and non-tender nondistended with positive bowel sounds. Skin: Skin intact, no lesions within the operative field. Neurologic: Sensation intact distally Psychiatric: Patient is competent for consent with normal mood and affect Lymphatic: No axillary or cervical lymphadenopathy  MUSCULOSKELETAL: left knee moderate swelling, skin clean and dry, compartments soft, tender throughout, motor and  sensory intact distally  Assessment: S82.112A DISPLACED FRACTURE OF LEFT TIBIA, PROXIMAL END  Plan: Plan for Procedure(s): OPEN REDUCTION INTERNAL FIXATION (ORIF) TIBIAL PLATEAU, LEFT  I discussed the risks and benefits of surgery. The risks include but are not limited to infection, bleeding requiring blood transfusion, nerve or blood vessel injury, joint stiffness or loss of motion, persistent pain, weakness or instability, malunion, nonunion and hardware failure and the need for further surgery. Medical risks include  but are not limited to DVT and pulmonary embolism, myocardial infarction, stroke, pneumonia, respiratory failure and death. Patient understood these risks and wished to proceed.   Lyndle Herrlich, MD   07/24/2018 2:53 PM

## 2019-07-21 IMAGING — CR LEFT KNEE - 1-2 VIEW
5 series · 5 of 5 positions shown · non-contrast
Comparison: Plain films and CT scan left knee 06/16/2018.

CLINICAL DATA: The patient suffered a medial tibial plateau
fracture in a fall playing disc golf 06/16/2018. Initial encounter.

EXAM:
LEFT KNEE - 1-2 VIEW; DG C-ARM 1-60 MIN-NO REPORT

[cont. (1 of 5)]
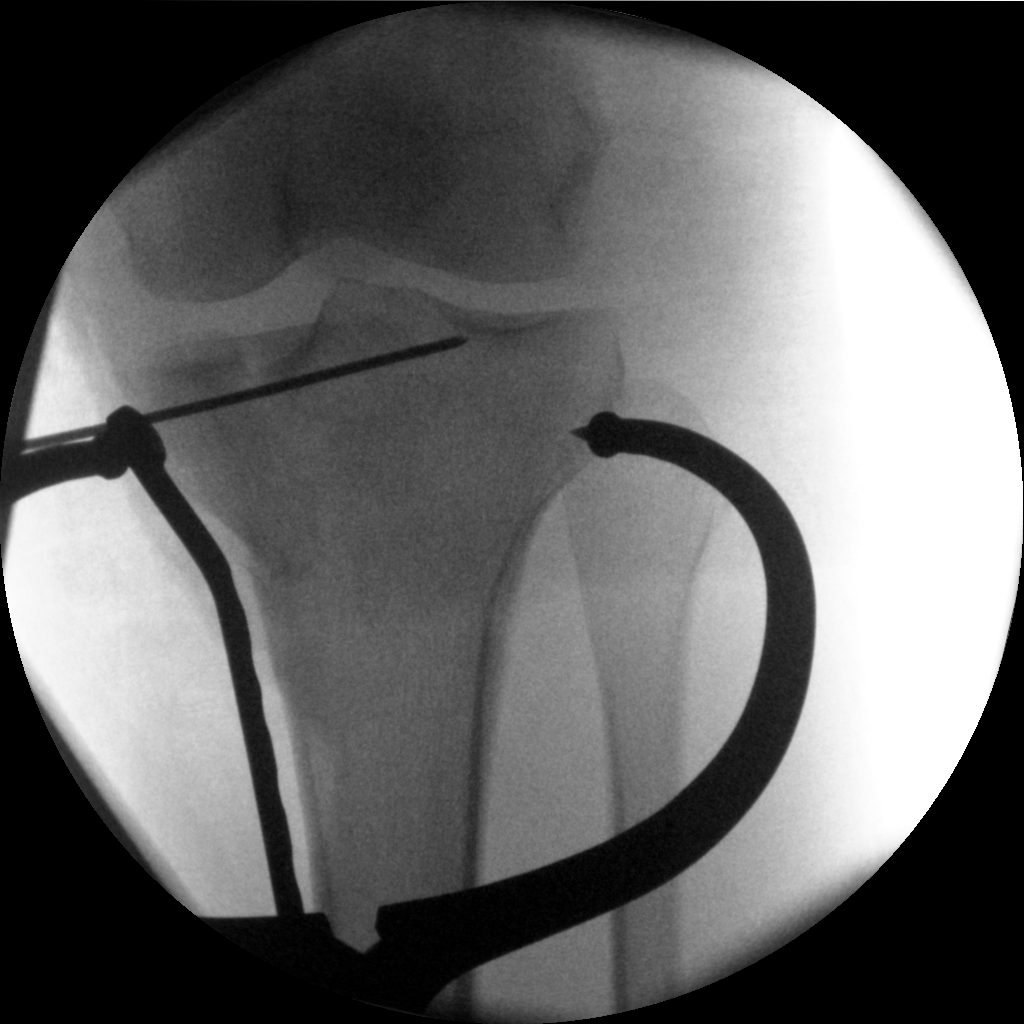

[cont. (2 of 5)]
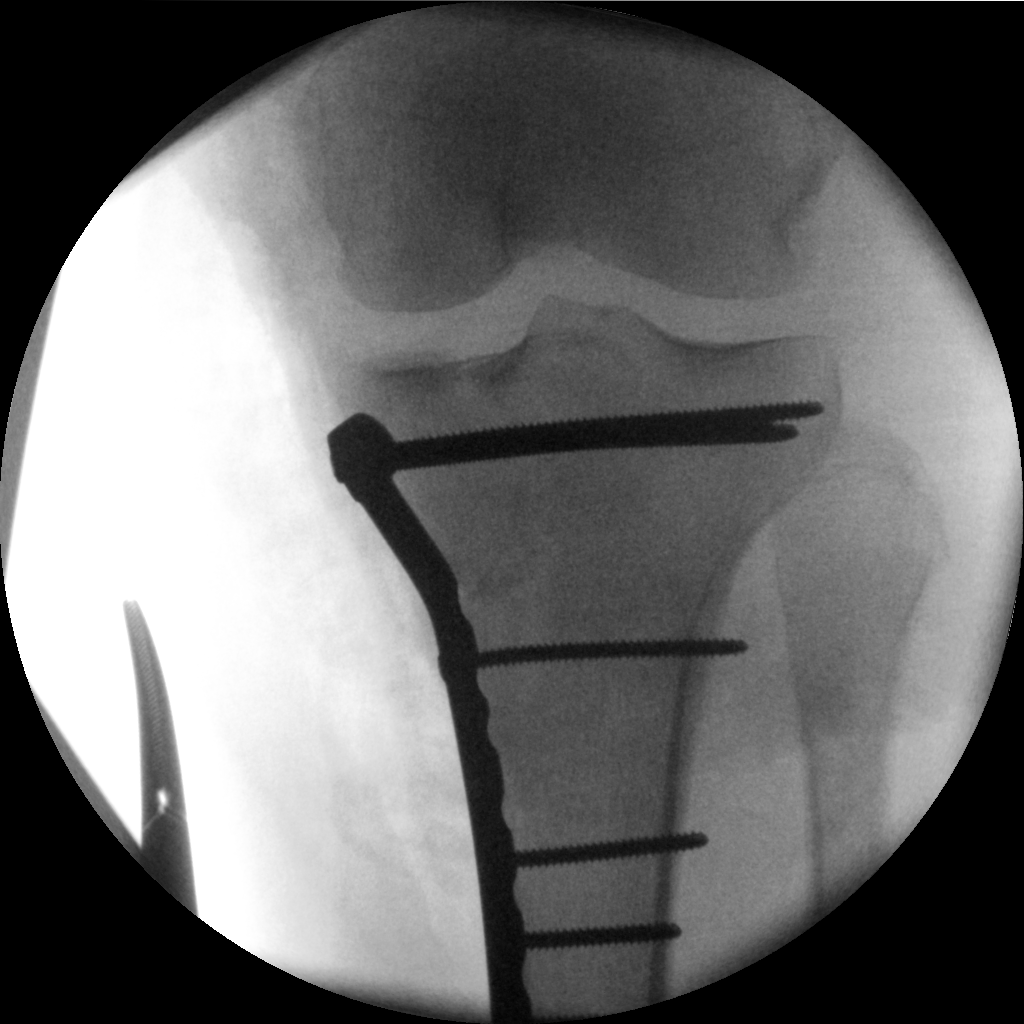

[cont. (3 of 5)]
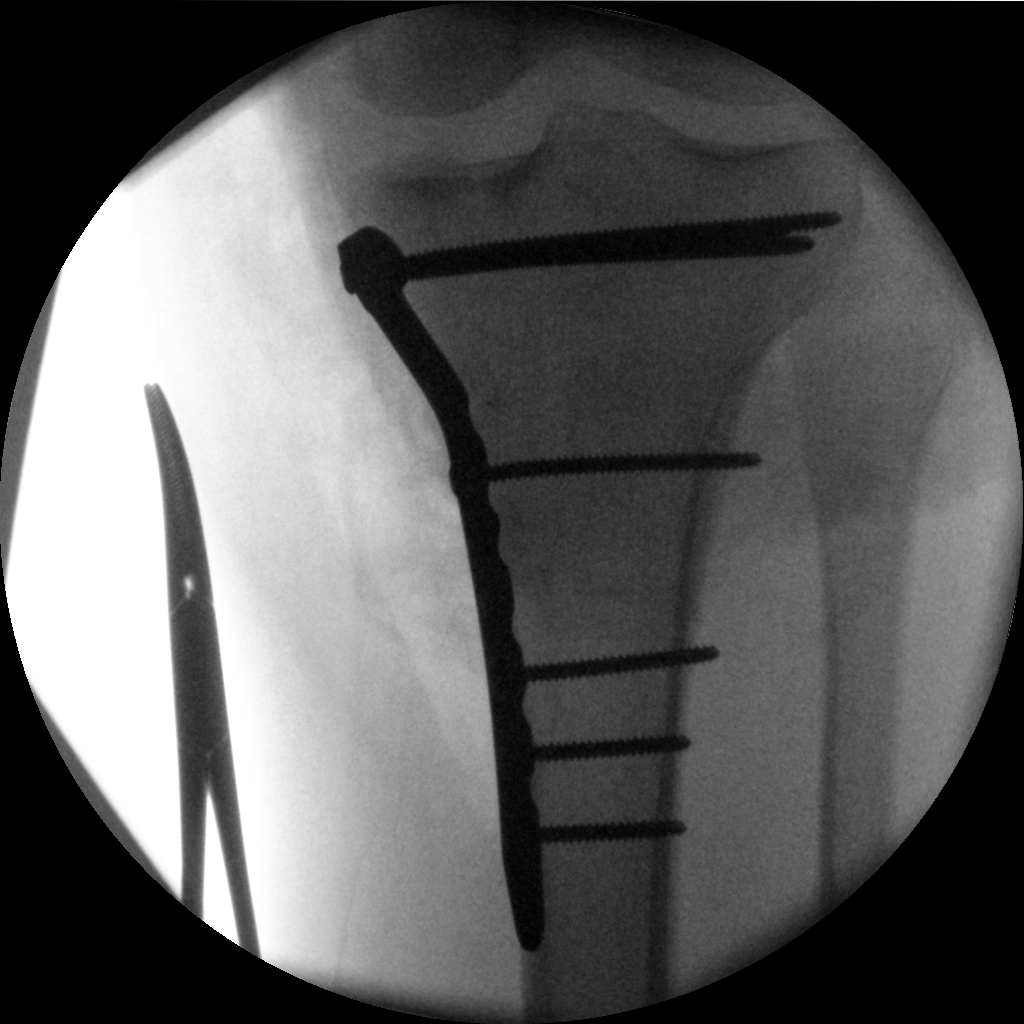

[cont. (4 of 5)]
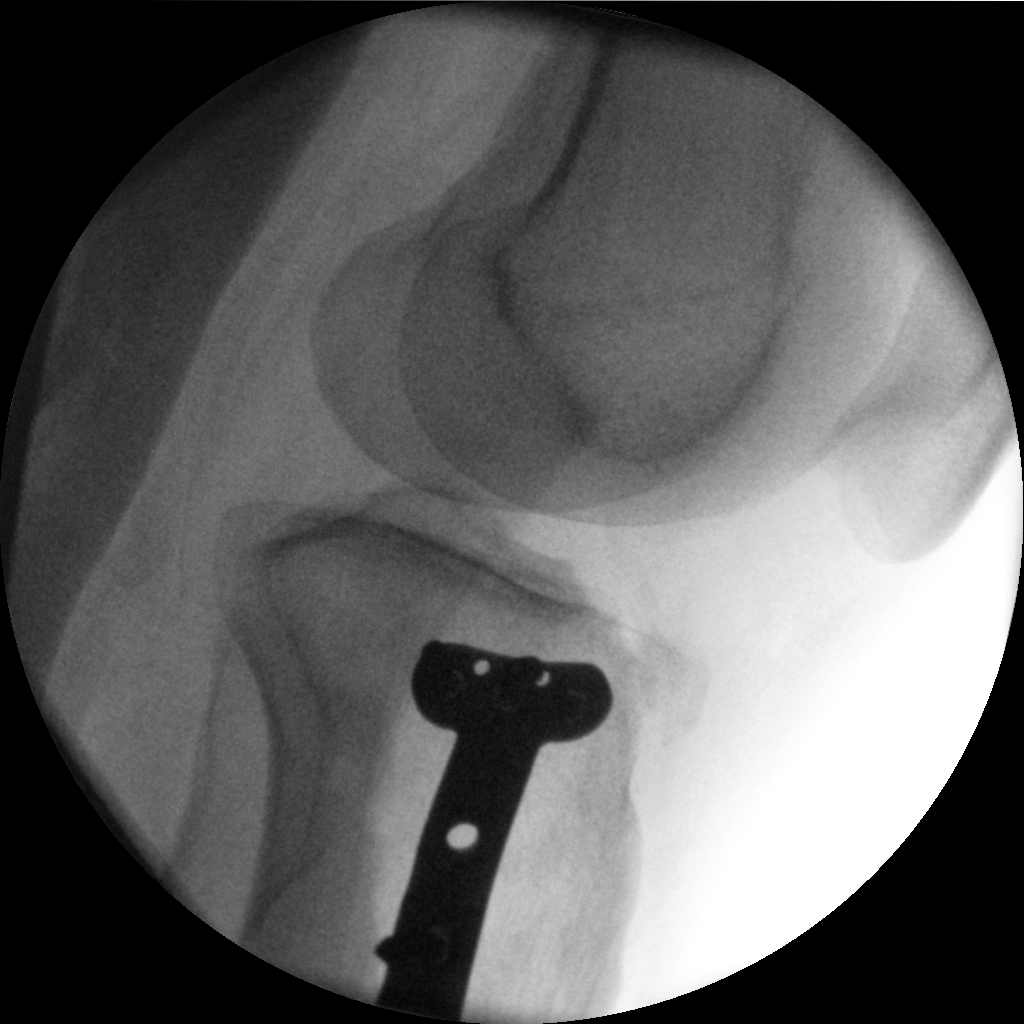

[cont. (5 of 5)]
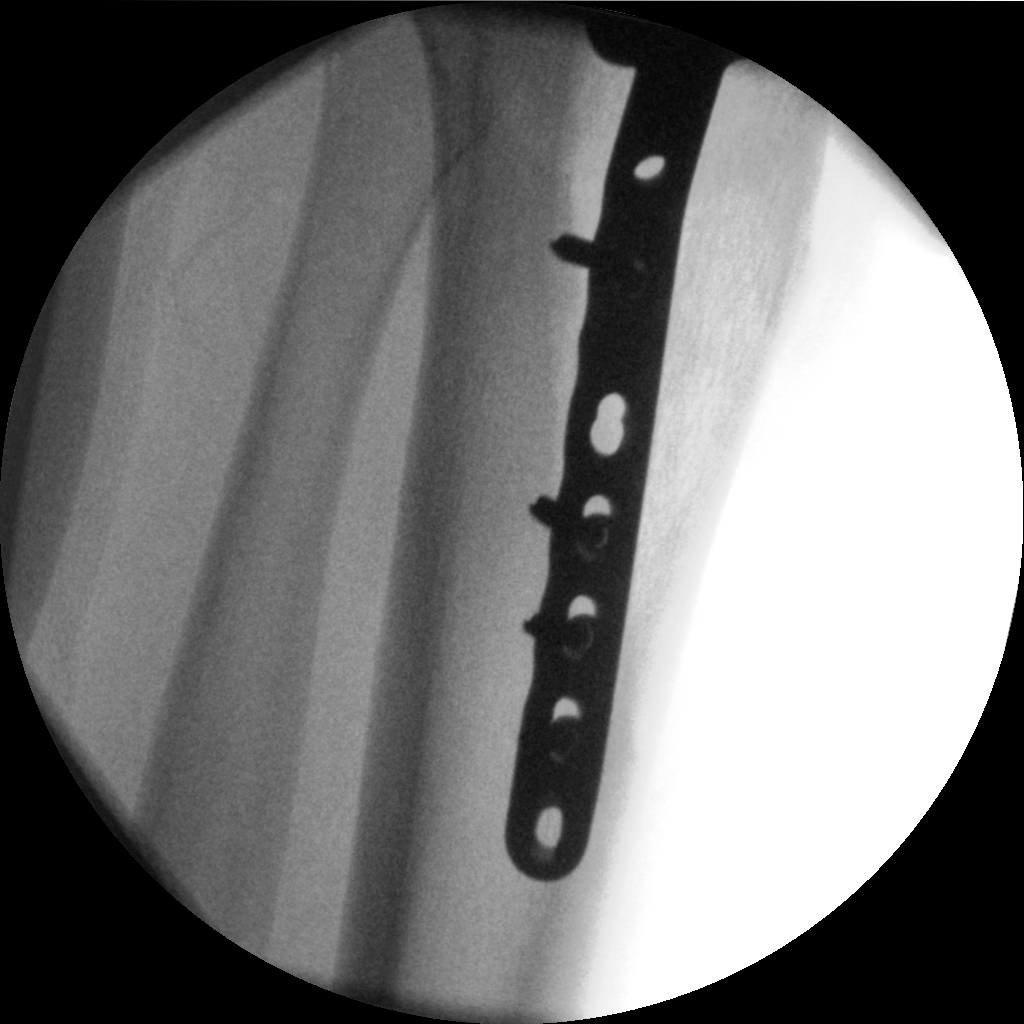

[5 of 5 positions shown; findings below may reference images not displayed]

FINDINGS: Five intraoperative fluoroscopic spot views of the left tibia are
provided. Images demonstrate placement of medial plate and screws
for fracture fixation. Hardware is intact. Position and alignment
appear anatomic. No acute abnormality.
IMPRESSION: Intraoperative imaging for fixation of a medial tibial plateau
fracture. No acute finding.

## 2023-02-28 ENCOUNTER — Other Ambulatory Visit (HOSPITAL_COMMUNITY): Payer: Self-pay

## 2024-05-23 ENCOUNTER — Ambulatory Visit: Admitting: Family Medicine
